# Patient Record
Sex: Female | Born: 1960 | Race: White | Hispanic: No | Marital: Single | Smoking: Never smoker
Health system: Southern US, Community
[De-identification: ages and names within clinical notes are randomized; demographics above are authoritative.]

## PROBLEM LIST (undated history)

## (undated) DIAGNOSIS — M8580 Other specified disorders of bone density and structure, unspecified site: Secondary | ICD-10-CM

## (undated) DIAGNOSIS — J302 Other seasonal allergic rhinitis: Secondary | ICD-10-CM

## (undated) DIAGNOSIS — C801 Malignant (primary) neoplasm, unspecified: Secondary | ICD-10-CM

## (undated) DIAGNOSIS — G4730 Sleep apnea, unspecified: Secondary | ICD-10-CM

## (undated) HISTORY — DX: Malignant (primary) neoplasm, unspecified: C80.1

## (undated) HISTORY — DX: Sleep apnea, unspecified: G47.30

## (undated) HISTORY — DX: Other specified disorders of bone density and structure, unspecified site: M85.80

## (undated) HISTORY — PX: ROTATOR CUFF REPAIR: SHX139

## (undated) HISTORY — PX: LABRAL REPAIR: SHX5172

## (undated) HISTORY — DX: Other seasonal allergic rhinitis: J30.2

## (undated) HISTORY — PX: COLONOSCOPY: SHX174

---
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
## 1989-02-19 HISTORY — PX: KNEE ARTHROSCOPY: SUR90

## 1990-02-19 HISTORY — PX: NASAL SINUS SURGERY: SHX719

## 2000-02-23 ENCOUNTER — Encounter: Payer: Self-pay | Admitting: Emergency Medicine

## 2000-02-23 ENCOUNTER — Emergency Department (HOSPITAL_COMMUNITY): Admission: EM | Admit: 2000-02-23 | Discharge: 2000-02-23 | Payer: Self-pay | Admitting: Emergency Medicine

## 2000-04-09 ENCOUNTER — Ambulatory Visit (HOSPITAL_COMMUNITY): Admission: RE | Admit: 2000-04-09 | Discharge: 2000-04-09 | Payer: Self-pay | Admitting: Orthopedic Surgery

## 2000-04-09 ENCOUNTER — Encounter: Payer: Self-pay | Admitting: Orthopedic Surgery

## 2000-04-18 ENCOUNTER — Encounter: Admission: RE | Admit: 2000-04-18 | Discharge: 2000-06-05 | Payer: Self-pay | Admitting: Orthopedic Surgery

## 2000-08-27 ENCOUNTER — Ambulatory Visit (HOSPITAL_COMMUNITY): Admission: RE | Admit: 2000-08-27 | Discharge: 2000-08-27 | Payer: Self-pay | Admitting: Neurosurgery

## 2000-08-27 ENCOUNTER — Encounter: Payer: Self-pay | Admitting: Neurosurgery

## 2001-11-21 ENCOUNTER — Encounter: Admission: RE | Admit: 2001-11-21 | Discharge: 2001-11-21 | Payer: Self-pay | Admitting: Internal Medicine

## 2001-11-28 ENCOUNTER — Encounter: Admission: RE | Admit: 2001-11-28 | Discharge: 2001-11-28 | Payer: Self-pay | Admitting: Neurosurgery

## 2001-11-28 ENCOUNTER — Encounter: Payer: Self-pay | Admitting: Neurosurgery

## 2001-12-12 ENCOUNTER — Encounter: Admission: RE | Admit: 2001-12-12 | Discharge: 2001-12-12 | Payer: Self-pay | Admitting: Internal Medicine

## 2001-12-15 ENCOUNTER — Encounter: Payer: Self-pay | Admitting: Neurosurgery

## 2001-12-15 ENCOUNTER — Encounter: Admission: RE | Admit: 2001-12-15 | Discharge: 2001-12-15 | Payer: Self-pay | Admitting: Neurosurgery

## 2002-01-05 ENCOUNTER — Encounter: Payer: Self-pay | Admitting: Neurosurgery

## 2002-01-05 ENCOUNTER — Encounter: Admission: RE | Admit: 2002-01-05 | Discharge: 2002-01-05 | Payer: Self-pay | Admitting: Neurosurgery

## 2002-01-14 ENCOUNTER — Encounter: Admission: RE | Admit: 2002-01-14 | Discharge: 2002-01-14 | Payer: Self-pay | Admitting: Internal Medicine

## 2002-03-06 ENCOUNTER — Encounter: Admission: RE | Admit: 2002-03-06 | Discharge: 2002-03-06 | Payer: Self-pay | Admitting: Obstetrics and Gynecology

## 2002-03-06 ENCOUNTER — Encounter: Payer: Self-pay | Admitting: Obstetrics and Gynecology

## 2002-03-09 ENCOUNTER — Encounter: Admission: RE | Admit: 2002-03-09 | Discharge: 2002-03-09 | Payer: Self-pay | Admitting: Internal Medicine

## 2002-11-25 ENCOUNTER — Encounter: Payer: Self-pay | Admitting: Neurosurgery

## 2002-11-25 ENCOUNTER — Encounter: Payer: Self-pay | Admitting: Radiology

## 2002-11-25 ENCOUNTER — Encounter: Admission: RE | Admit: 2002-11-25 | Discharge: 2002-11-25 | Payer: Self-pay | Admitting: Neurosurgery

## 2003-02-25 ENCOUNTER — Encounter: Admission: RE | Admit: 2003-02-25 | Discharge: 2003-02-25 | Payer: Self-pay | Admitting: Neurosurgery

## 2003-07-08 ENCOUNTER — Encounter: Admission: RE | Admit: 2003-07-08 | Discharge: 2003-07-08 | Payer: Self-pay | Admitting: Neurosurgery

## 2004-01-14 ENCOUNTER — Encounter: Admission: RE | Admit: 2004-01-14 | Discharge: 2004-01-14 | Payer: Self-pay | Admitting: Neurosurgery

## 2004-03-15 ENCOUNTER — Encounter: Admission: RE | Admit: 2004-03-15 | Discharge: 2004-03-15 | Payer: Self-pay | Admitting: Neurosurgery

## 2004-08-03 ENCOUNTER — Encounter: Admission: RE | Admit: 2004-08-03 | Discharge: 2004-08-03 | Payer: Self-pay | Admitting: Neurosurgery

## 2004-09-27 ENCOUNTER — Encounter: Admission: RE | Admit: 2004-09-27 | Discharge: 2004-09-27 | Payer: Self-pay | Admitting: Obstetrics and Gynecology

## 2005-01-29 ENCOUNTER — Encounter: Admission: RE | Admit: 2005-01-29 | Discharge: 2005-01-29 | Payer: Self-pay | Admitting: Neurosurgery

## 2005-02-19 DIAGNOSIS — C801 Malignant (primary) neoplasm, unspecified: Secondary | ICD-10-CM

## 2005-02-19 HISTORY — DX: Malignant (primary) neoplasm, unspecified: C80.1

## 2005-02-19 HISTORY — PX: BREAST RECONSTRUCTION: SHX9

## 2005-02-19 HISTORY — PX: OTHER SURGICAL HISTORY: SHX169

## 2005-09-28 ENCOUNTER — Encounter: Admission: RE | Admit: 2005-09-28 | Discharge: 2005-09-28 | Payer: Self-pay | Admitting: Obstetrics and Gynecology

## 2005-10-04 ENCOUNTER — Encounter: Admission: RE | Admit: 2005-10-04 | Discharge: 2005-10-04 | Payer: Self-pay | Admitting: Obstetrics and Gynecology

## 2005-10-04 ENCOUNTER — Encounter (INDEPENDENT_AMBULATORY_CARE_PROVIDER_SITE_OTHER): Payer: Self-pay | Admitting: *Deleted

## 2005-10-04 ENCOUNTER — Encounter (INDEPENDENT_AMBULATORY_CARE_PROVIDER_SITE_OTHER): Payer: Self-pay | Admitting: Radiology

## 2005-10-10 ENCOUNTER — Ambulatory Visit: Payer: Self-pay | Admitting: Oncology

## 2005-10-11 ENCOUNTER — Encounter: Admission: RE | Admit: 2005-10-11 | Discharge: 2005-10-11 | Payer: Self-pay | Admitting: Obstetrics and Gynecology

## 2005-10-11 ENCOUNTER — Encounter (INDEPENDENT_AMBULATORY_CARE_PROVIDER_SITE_OTHER): Payer: Self-pay | Admitting: Specialist

## 2006-03-12 ENCOUNTER — Ambulatory Visit: Admission: RE | Admit: 2006-03-12 | Discharge: 2006-05-23 | Payer: Self-pay | Admitting: Radiation Oncology

## 2006-06-24 ENCOUNTER — Ambulatory Visit: Admission: RE | Admit: 2006-06-24 | Discharge: 2006-08-27 | Payer: Self-pay | Admitting: Radiation Oncology

## 2006-09-12 ENCOUNTER — Ambulatory Visit: Payer: Self-pay | Admitting: Internal Medicine

## 2006-09-18 ENCOUNTER — Ambulatory Visit: Payer: Self-pay | Admitting: Internal Medicine

## 2007-06-03 DIAGNOSIS — Z853 Personal history of malignant neoplasm of breast: Secondary | ICD-10-CM | POA: Insufficient documentation

## 2007-06-03 DIAGNOSIS — Z8719 Personal history of other diseases of the digestive system: Secondary | ICD-10-CM

## 2008-12-07 ENCOUNTER — Ambulatory Visit (HOSPITAL_COMMUNITY): Admission: RE | Admit: 2008-12-07 | Discharge: 2008-12-07 | Payer: Self-pay | Admitting: Neurosurgery

## 2008-12-07 ENCOUNTER — Encounter (INDEPENDENT_AMBULATORY_CARE_PROVIDER_SITE_OTHER): Payer: Self-pay | Admitting: Neurosurgery

## 2009-02-19 HISTORY — PX: ROBOTIC ASSISTED LAP VAGINAL HYSTERECTOMY: SHX2362

## 2010-02-10 ENCOUNTER — Encounter
Admission: RE | Admit: 2010-02-10 | Discharge: 2010-02-10 | Payer: Self-pay | Source: Home / Self Care | Attending: Neurosurgery | Admitting: Neurosurgery

## 2010-05-25 LAB — BASIC METABOLIC PANEL
BUN: 12 mg/dL (ref 6–23)
CO2: 30 mEq/L (ref 19–32)
GFR calc Af Amer: >60 mL/min (ref >60)
GFR calc non Af Amer: >60 mL/min (ref >60)
Potassium: 4 mEq/L (ref 3.5–5.1)
Sodium: 143 mEq/L (ref 135–145)

## 2010-05-25 LAB — CBC
MCV: 86.8 fL (ref 78.0–100.0)
RDW: 14.4 % (ref 11.5–15.5)

## 2010-07-04 NOTE — Assessment & Plan Note (Signed)
[facility]                         GASTROENTEROLOGY OFFICE NOTE   NAME:Szczesniak, DYLANN LAYNE                       MRN:          [id number]  DATE:09/12/2006                            DOB:          02/23/1960    CHIEF COMPLAINT:  Trouble swallowing.   HISTORY:  Ms. Shor is a 50 year old white women that is complaining of  problems with food like corn and rice with a suprasternal sticking  point.  She has had that for some time and even liquids would bother  her.  However, recently she had a couple of episodes, one of which she  required the Heimlich maneuver she says to relieve the food impactions.  She had been seen by Dr. Corinda Gubler in the past for abdominal pain and had  an upper endoscopy with gastritis and was treated for that, she had  Helicobacter pylori which I think she was treated for and that seemed to  resolve.  She does not really have a lot of heartburn problems or  anything like that.   PAST MEDICAL HISTORY:  Diagnosis of breast cancer.  Had bilateral  mastectomy because of premalignant or dysplastic changes in the other  breast with radiation chemo and bilateral breast reconstruction.  Radiation chemotherapy just finished earlier this month.  Followed by  Dr. Abbe Amsterdam in [place].   REVIEW OF SYSTEMS:  She has had some palpitations and sweats.  She has  stiffness in her hip.  She has had a skin rash, rhinorrhea and night  sweats and some hearing difficulty.  All other systems are negative.   MEDICATIONS:  1. Tamoxifen daily.  2. BC powder p.r.n.   DRUG ALLERGIES:  None known.   FAMILY HISTORY:  Negative for colon cancer.  There is breast cancer and  diabetes in the family.   SOCIAL HISTORY:  She is single.  She works in collections.  She is not a  smoker.   PHYSICAL EXAMINATION:  GENERAL:  Reveals a well-developed, well-  nourished middle aged white woman.  VITAL SIGNS:  Height 5 feet 7 inches, weight 153 pounds, pulse 68,  blood  pressure 96/64.  EYES:  Anicteric.  MOUTH:  Posterior pharynx free of wheezes.  NECK:  Supple without thyromegaly or mass.  CHEST:  Clear.  HEART:  S1, S2, no murmurs, rubs or gallops.  ABDOMEN:  Soft, nontender without organomegaly or mass.  LOWER EXTREMITY:  Free of edema.  SKIN:  Warm and dry.  I see no acute rash at this time.  There is some  mild hyperpigmented areas on the back.  LYMPH NODES:  No neck or supraclavicular nodes.   ASSESSMENT:  Dysphagia.  She says this actually started a number of  years ago, even before her radiation chemo.  It sounds more like a  peptic stricture problem.  She does not have heartburn.  Motility  disturbance is possible.  She has choked on some liquids which raises  some question of oropharyngeal problems, but it sounds more like an  esophageal process.   PLAN:  1. Samples of Aciphex 20 mg daily.  2. Modify diet  in the interim to avoid dysphagia as much as possible.  3. Schedule upper GI endoscopy with esophageal dilation.  4. Pending that other studies including barium swallow and manometry      could be indicated.     Iva Boop, MD,FACG  Electronically Signed    CEG/MedQ  DD: 09/12/2006  DT: 09/13/2006  Job #: [id number]   cc:   Bonnye Fava

## 2011-11-16 ENCOUNTER — Telehealth: Payer: Self-pay | Admitting: Internal Medicine

## 2011-11-16 NOTE — Telephone Encounter (Signed)
Dr. Gessner do you approve? 

## 2011-11-16 NOTE — Telephone Encounter (Signed)
Dr. Perry will you accept? 

## 2011-11-16 NOTE — Telephone Encounter (Signed)
sure

## 2011-11-19 ENCOUNTER — Encounter: Payer: Self-pay | Admitting: Internal Medicine

## 2011-11-19 NOTE — Telephone Encounter (Signed)
okay

## 2011-11-19 NOTE — Telephone Encounter (Signed)
Pt scheduled for colon 12/20/11 with Dr. Marina Goodell.

## 2011-12-07 ENCOUNTER — Encounter: Payer: Self-pay | Admitting: Internal Medicine

## 2011-12-07 ENCOUNTER — Ambulatory Visit (AMBULATORY_SURGERY_CENTER): Payer: Managed Care, Other (non HMO) | Admitting: *Deleted

## 2011-12-07 VITALS — Ht 68.0 in | Wt 164.6 lb

## 2011-12-07 DIAGNOSIS — Z1211 Encounter for screening for malignant neoplasm of colon: Secondary | ICD-10-CM

## 2011-12-07 MED ORDER — MOVIPREP 100 G PO SOLR: ORAL | Status: DC

## 2011-12-20 ENCOUNTER — Ambulatory Visit (AMBULATORY_SURGERY_CENTER): Payer: Managed Care, Other (non HMO) | Admitting: Internal Medicine

## 2011-12-20 ENCOUNTER — Encounter: Payer: Self-pay | Admitting: Internal Medicine

## 2011-12-20 VITALS — BP 108/68 | HR 52 | Temp 98.3°F | Resp 14 | Ht 68.0 in | Wt 164.0 lb

## 2011-12-20 DIAGNOSIS — Z1211 Encounter for screening for malignant neoplasm of colon: Secondary | ICD-10-CM

## 2011-12-20 DIAGNOSIS — D126 Benign neoplasm of colon, unspecified: Secondary | ICD-10-CM

## 2011-12-20 MED ORDER — SODIUM CHLORIDE 0.9 % IV SOLN: 500.0000 mL | INTRAVENOUS | Status: DC

## 2011-12-20 NOTE — Progress Notes (Signed)
Patient did not have preoperative order for IV antibiotic SSI prophylaxis. (G8918)  Patient did not experience any of the following events: a burn prior to discharge; a fall within the facility; wrong site/side/patient/procedure/implant event; or a hospital transfer or hospital admission upon discharge from the facility. (G8907)  

## 2011-12-20 NOTE — Op Note (Signed)
[facility] [address] [place], [address]   COLONOSCOPY PROCEDURE REPORT  PATIENT: Kayla Cunningham, Kayla Cunningham  MR#: [id number] BIRTHDATE: 1960/03/06 , 51  yrs. old GENDER: Female ENDOSCOPIST: Roxy Cedar, MD REFERRED BY:.Direct Self PROCEDURE DATE:  12/20/2011 PROCEDURE:   Colonoscopy with snare polypectomy    x 3 ASA CLASS:   Class II INDICATIONS:average risk screening. MEDICATIONS: MAC sedation, administered by CRNA and propofol (Diprivan) 300mg  IV  DESCRIPTION OF PROCEDURE:   After the risks benefits and alternatives of the procedure were thoroughly explained, informed consent was obtained.  A digital rectal exam revealed no abnormalities of the rectum.   The LB [id number]  endoscope was introduced through the anus and advanced to the cecum, which was identified by both the appendix and ileocecal valve. No adverse events experienced.   The quality of the prep was excellent, using MoviPrep  The instrument was then slowly withdrawn as the colon was fully examined.      COLON FINDINGS: Three polyps ranging between 3-20mm in size were found at the cecum, in the ascending colon, and transverse colon. A polypectomy was performed with a cold snare.  The resection was complete and the polyp tissue was completely retrieved.   The colon mucosa was otherwise normal.  Retroflexed views revealed no abnormalities. The time to cecum=8 minutes 04 seconds.  Withdrawal time=11 minutes 18 seconds.  The scope was withdrawn and the procedure completed. COMPLICATIONS: There were no complications.  ENDOSCOPIC IMPRESSION: 1.   Three polyps ranging between 3-64mm in size were found at the cecum, in the ascending colon, and transverse colon; polypectomy was performed with a cold snare 2.   The colon mucosa was otherwise normal  RECOMMENDATIONS: 1. Repeat Colonoscopy in 3 years if all polyps adenomatous; otherwise 5 years.   eSigned:  Roxy Cedar, MD 12/20/2011  11:12 AM   cc: Marinda Elk, MD and The Patient   PATIENT NAME:  Kayla Cunningham, Kayla Cunningham MR#: [id number]

## 2011-12-20 NOTE — Patient Instructions (Signed)
YOU HAD AN ENDOSCOPIC PROCEDURE TODAY AT THE [facility]: Refer to the procedure report that was given to you for any specific questions about what was found during the examination.  If the procedure report does not answer your questions, please call your gastroenterologist to clarify.  If you requested that your care partner not be given the details of your procedure findings, then the procedure report has been included in a sealed envelope for you to review at your convenience later.  YOU SHOULD EXPECT: Some feelings of bloating in the abdomen. Passage of more gas than usual.  Walking can help get rid of the air that was put into your GI tract during the procedure and reduce the bloating. If you had a lower endoscopy (such as a colonoscopy or flexible sigmoidoscopy) you may notice spotting of blood in your stool or on the toilet paper. If you underwent a bowel prep for your procedure, then you may not have a normal bowel movement for a few days.  DIET: Your first meal following the procedure should be a light meal and then it is ok to progress to your normal diet.  A half-sandwich or bowl of soup is an example of a good first meal.  Heavy or fried foods are harder to digest and may make you feel nauseous or bloated.  Likewise meals heavy in dairy and vegetables can cause extra gas to form and this can also increase the bloating.  Drink plenty of fluids but you should avoid alcoholic beverages for 24 hours.  ACTIVITY: Your care partner should take you home directly after the procedure.  You should plan to take it easy, moving slowly for the rest of the day.  You can resume normal activity the day after the procedure however you should NOT DRIVE or use heavy machinery for 24 hours (because of the sedation medicines used during the test).    SYMPTOMS TO REPORT IMMEDIATELY: A gastroenterologist can be reached at any hour.  During normal business hours, 8:30 AM to 5:00 PM Monday through Friday,  call [phone].  After hours and on weekends, please call the GI answering service at [phone] who will take a message and have the physician on call contact you.   Following lower endoscopy (colonoscopy or flexible sigmoidoscopy):  Excessive amounts of blood in the stool  Significant tenderness or worsening of abdominal pains  Swelling of the abdomen that is new, acute  Fever of 100F or higher   FOLLOW UP: If any biopsies were taken you will be contacted by phone or by letter within the next 1-3 weeks.  Call your gastroenterologist if you have not heard about the biopsies in 3 weeks.  Our staff will call the home number listed on your records the next business day following your procedure to check on you and address any questions or concerns that you may have at that time regarding the information given to you following your procedure. This is a courtesy call and so if there is no answer at the home number and we have not heard from you through the emergency physician on call, we will assume that you have returned to your regular daily activities without incident.  SIGNATURES/CONFIDENTIALITY: You and/or your care partner have signed paperwork which will be entered into your electronic medical record.  These signatures attest to the fact that that the information above on your After Visit Summary has been reviewed and is understood.  Full responsibility of the confidentiality of   this discharge information lies with you and/or your care-partner.   INFORMATION ON POLYPS GIVEN TO YOU TODAY 

## 2011-12-21 ENCOUNTER — Telehealth: Payer: Self-pay | Admitting: *Deleted

## 2011-12-21 NOTE — Telephone Encounter (Signed)
  Follow up Call-  Call back number 12/20/2011  Post procedure Call Back phone  # [phone]  Permission to leave phone message Yes     Patient questions:  Do you have a fever, pain , or abdominal swelling? no Pain Score  0 *  Have you tolerated food without any problems? yes  Have you been able to return to your normal activities? yes  Do you have any questions about your discharge instructions: Diet   no Medications  no Follow up visit  no  Do you have questions or concerns about your Care? no  Actions: * If pain score is 4 or above: No action needed, pain <4.

## 2011-12-26 ENCOUNTER — Encounter: Payer: Self-pay | Admitting: Internal Medicine

## 2012-10-02 DIAGNOSIS — M8580 Other specified disorders of bone density and structure, unspecified site: Secondary | ICD-10-CM | POA: Insufficient documentation

## 2013-03-31 DIAGNOSIS — Z5181 Encounter for therapeutic drug level monitoring: Secondary | ICD-10-CM | POA: Insufficient documentation

## 2014-02-19 HISTORY — PX: LIPOMA EXCISION: SHX5283

## 2014-07-29 DIAGNOSIS — Z9013 Acquired absence of bilateral breasts and nipples: Secondary | ICD-10-CM | POA: Insufficient documentation

## 2014-10-28 ENCOUNTER — Encounter: Payer: Self-pay | Admitting: Internal Medicine

## 2014-12-30 ENCOUNTER — Ambulatory Visit (AMBULATORY_SURGERY_CENTER): Payer: Self-pay

## 2014-12-30 VITALS — Ht 67.0 in | Wt 174.4 lb

## 2014-12-30 DIAGNOSIS — Z860100 Personal history of colon polyps, unspecified: Secondary | ICD-10-CM

## 2014-12-30 MED ORDER — SUPREP BOWEL PREP KIT 17.5-3.13-1.6 GM/177ML PO SOLN: 1.0000 | Freq: Once | ORAL | Status: DC

## 2014-12-30 NOTE — Progress Notes (Signed)
No allergies to eggs or soy No home oxygen (CPAP only) No diet/weight loss meds No past problems with anesthesia  Refused emmi; has email has internet

## 2015-01-06 ENCOUNTER — Ambulatory Visit (AMBULATORY_SURGERY_CENTER): Payer: Managed Care, Other (non HMO) | Admitting: Internal Medicine

## 2015-01-06 ENCOUNTER — Encounter: Payer: Self-pay | Admitting: Internal Medicine

## 2015-01-06 VITALS — BP 137/63 | HR 62 | Temp 97.6°F | Resp 18 | Ht 67.0 in | Wt 174.0 lb

## 2015-01-06 DIAGNOSIS — Z86010 Personal history of colonic polyps: Secondary | ICD-10-CM | POA: Diagnosis present

## 2015-01-06 DIAGNOSIS — D123 Benign neoplasm of transverse colon: Secondary | ICD-10-CM | POA: Diagnosis not present

## 2015-01-06 MED ORDER — SODIUM CHLORIDE 0.9 % IV SOLN: 500.0000 mL | INTRAVENOUS | Status: DC

## 2015-01-06 NOTE — Op Note (Signed)
[facility]  [address] [place], [id number]   COLONOSCOPY PROCEDURE REPORT  PATIENT: Kayla Cunningham, Kayla Cunningham  MR#: [id number] BIRTHDATE: 11/11/1960 , 35  yrs. old GENDER: female ENDOSCOPIST: Eustace Quail, MD REFERRED [id number] Program Recall PROCEDURE DATE:  01/06/2015 PROCEDURE:   Colonoscopy, surveillance and Colonoscopy with snare polypectomy x 1 First Screening Colonoscopy - Avg.  risk and is 50 yrs.  old or older - No.  Prior Negative Screening - Now for repeat screening. N/A  History of Adenoma - Now for follow-up colonoscopy & has been > or = to 3 yrs.  Yes hx of adenoma.  Has been 3 or more years since last colonoscopy.  Polyps removed today? Yes ASA CLASS:   Class II INDICATIONS:Surveillance due to prior colonic neoplasia and PH Colon Adenoma.   . Index examination October 2013 w/ 3 tubular adenomas MEDICATIONS: Monitored anesthesia care and Propofol 300 mg IV  DESCRIPTION OF PROCEDURE:   After the risks benefits and alternatives of the procedure were thoroughly explained, informed consent was obtained.  The digital rectal exam revealed no abnormalities of the rectum.   The LB [id number]  endoscope was introduced through the anus and advanced to the cecum, which was identified by both the appendix and ileocecal valve. No adverse events experienced.   The quality of the prep was excellent. (Suprep was used)  The instrument was then slowly withdrawn as the colon was fully examined. Estimated blood loss is zero unless otherwise noted in this procedure report.      COLON FINDINGS: A single polyp measuring 5 mm in size was found in the transverse colon.  A polypectomy was performed with a cold snare.  The resection was complete, the polyp tissue was completely retrieved and sent to histology.   The examination was otherwise normal.  Retroflexed views revealed no abnormalities. The time to cecum = 5.8 Withdrawal time = 12.2   The scope was  withdrawn and the procedure completed. COMPLICATIONS: There were no immediate complications.  ENDOSCOPIC IMPRESSION: 1.   Single polyp was found in the transverse colon; polypectomy was performed with a cold snare 2.   The examination was otherwise normal  RECOMMENDATIONS: 1. Follow up colonoscopy in 5 years  eSigned:  Eustace Quail, MD 01/06/2015 11:27 AM   cc: The Patient and Briscoe Deutscher, MD

## 2015-01-06 NOTE — Patient Instructions (Signed)
YOU HAD AN ENDOSCOPIC PROCEDURE TODAY AT THE [facility]:   Refer to the procedure report that was given to you for any specific questions about what was found during the examination.  If the procedure report does not answer your questions, please call your gastroenterologist to clarify.  If you requested that your care partner not be given the details of your procedure findings, then the procedure report has been included in a sealed envelope for you to review at your convenience later.  YOU SHOULD EXPECT: Some feelings of bloating in the abdomen. Passage of more gas than usual.  Walking can help get rid of the air that was put into your GI tract during the procedure and reduce the bloating. If you had a lower endoscopy (such as a colonoscopy or flexible sigmoidoscopy) you may notice spotting of blood in your stool or on the toilet paper. If you underwent a bowel prep for your procedure, you may not have a normal bowel movement for a few days.  Please Note:  You might notice some irritation and congestion in your nose or some drainage.  This is from the oxygen used during your procedure.  There is no need for concern and it should clear up in a day or so.  SYMPTOMS TO REPORT IMMEDIATELY:   Following lower endoscopy (colonoscopy or flexible sigmoidoscopy):  Excessive amounts of blood in the stool  Significant tenderness or worsening of abdominal pains  Swelling of the abdomen that is new, acute  Fever of 100F or higher   For urgent or emergent issues, a gastroenterologist can be reached at any hour by calling [phone].   DIET: Your first meal following the procedure should be a small meal and then it is ok to progress to your normal diet. Heavy or fried foods are harder to digest and may make you feel nauseous or bloated.  Likewise, meals heavy in dairy and vegetables can increase bloating.  Drink plenty of fluids but you should avoid alcoholic beverages for 24  hours.  ACTIVITY:  You should plan to take it easy for the rest of today and you should NOT DRIVE or use heavy machinery until tomorrow (because of the sedation medicines used during the test).    FOLLOW UP: Our staff will call the number listed on your records the next business day following your procedure to check on you and address any questions or concerns that you may have regarding the information given to you following your procedure. If we do not reach you, we will leave a message.  However, if you are feeling well and you are not experiencing any problems, there is no need to return our call.  We will assume that you have returned to your regular daily activities without incident.  If any biopsies were taken you will be contacted by phone or by letter within the next 1-3 weeks.  Please call us at [phone] if you have not heard about the biopsies in 3 weeks.    SIGNATURES/CONFIDENTIALITY: You and/or your care partner have signed paperwork which will be entered into your electronic medical record.  These signatures attest to the fact that that the information above on your After Visit Summary has been reviewed and is understood.  Full responsibility of the confidentiality of this discharge information lies with you and/or your care-partner.  Polyp information given.  

## 2015-01-06 NOTE — Progress Notes (Signed)
A/ox3 pleased with MAC, report to Jane RN 

## 2015-01-06 NOTE — Progress Notes (Signed)
Called to room to assist during endoscopic procedure.  Patient ID and intended procedure confirmed with present staff. Received instructions for my participation in the procedure from the performing physician.  

## 2015-01-07 ENCOUNTER — Telehealth: Payer: Self-pay | Admitting: Internal Medicine

## 2015-01-07 NOTE — Telephone Encounter (Signed)
°  Follow up Call-  Call back number 01/06/2015  Post procedure Call Back phone  # [phone]  Permission to leave phone message Yes     Patient questions:  Do you have a fever, pain , or abdominal swelling? No. Pain Score  0 *  Have you tolerated food without any problems? Yes.    Have you been able to return to your normal activities? Yes.    Do you have any questions about your discharge instructions: Diet   No. Medications  No. Follow up visit  No.  Do you have questions or concerns about your Care? No.  Actions: * If pain score is 4 or above: No action needed, pain <4.

## 2015-01-11 ENCOUNTER — Encounter: Payer: Self-pay | Admitting: Internal Medicine

## 2016-05-25 DIAGNOSIS — Z9189 Other specified personal risk factors, not elsewhere classified: Secondary | ICD-10-CM | POA: Insufficient documentation

## 2017-12-08 DIAGNOSIS — H029 Unspecified disorder of eyelid: Secondary | ICD-10-CM | POA: Insufficient documentation

## 2018-02-04 ENCOUNTER — Other Ambulatory Visit: Payer: Self-pay | Admitting: Neurosurgery

## 2018-02-04 DIAGNOSIS — M4712 Other spondylosis with myelopathy, cervical region: Secondary | ICD-10-CM

## 2018-02-10 ENCOUNTER — Ambulatory Visit
Admission: RE | Admit: 2018-02-10 | Discharge: 2018-02-10 | Disposition: A | Discharge status: Home / Self Care | Payer: Commercial Managed Care - PPO | Source: Ambulatory Visit | Attending: Neurosurgery | Admitting: Neurosurgery

## 2018-02-10 ENCOUNTER — Other Ambulatory Visit: Payer: Self-pay | Admitting: Neurosurgery

## 2018-02-10 DIAGNOSIS — M4712 Other spondylosis with myelopathy, cervical region: Secondary | ICD-10-CM

## 2018-02-10 DIAGNOSIS — M545 Low back pain, unspecified: Secondary | ICD-10-CM

## 2018-02-20 DIAGNOSIS — S39012A Strain of muscle, fascia and tendon of lower back, initial encounter: Secondary | ICD-10-CM | POA: Diagnosis not present

## 2018-02-20 DIAGNOSIS — M4712 Other spondylosis with myelopathy, cervical region: Secondary | ICD-10-CM | POA: Diagnosis not present

## 2018-02-20 DIAGNOSIS — S161XXA Strain of muscle, fascia and tendon at neck level, initial encounter: Secondary | ICD-10-CM | POA: Diagnosis not present

## 2018-03-25 DIAGNOSIS — S39012A Strain of muscle, fascia and tendon of lower back, initial encounter: Secondary | ICD-10-CM | POA: Diagnosis not present

## 2018-03-25 DIAGNOSIS — M47816 Spondylosis without myelopathy or radiculopathy, lumbar region: Secondary | ICD-10-CM | POA: Diagnosis not present

## 2018-03-27 ENCOUNTER — Other Ambulatory Visit: Payer: Self-pay | Admitting: Neurosurgery

## 2018-03-27 DIAGNOSIS — M47816 Spondylosis without myelopathy or radiculopathy, lumbar region: Secondary | ICD-10-CM

## 2018-04-17 ENCOUNTER — Other Ambulatory Visit: Payer: Self-pay | Admitting: Neurosurgery

## 2018-04-17 DIAGNOSIS — M47816 Spondylosis without myelopathy or radiculopathy, lumbar region: Secondary | ICD-10-CM

## 2018-04-23 ENCOUNTER — Ambulatory Visit
Admission: RE | Admit: 2018-04-23 | Discharge: 2018-04-23 | Disposition: A | Discharge status: Home / Self Care | Payer: Commercial Managed Care - PPO | Source: Ambulatory Visit | Attending: Neurosurgery | Admitting: Neurosurgery

## 2018-04-23 DIAGNOSIS — M5136 Other intervertebral disc degeneration, lumbar region: Secondary | ICD-10-CM | POA: Diagnosis not present

## 2018-04-23 DIAGNOSIS — M47816 Spondylosis without myelopathy or radiculopathy, lumbar region: Secondary | ICD-10-CM

## 2018-04-29 ENCOUNTER — Ambulatory Visit
Admission: RE | Admit: 2018-04-29 | Discharge: 2018-04-29 | Disposition: A | Discharge status: Home / Self Care | Payer: Commercial Managed Care - PPO | Source: Ambulatory Visit | Attending: Neurosurgery | Admitting: Neurosurgery

## 2018-04-29 DIAGNOSIS — M47816 Spondylosis without myelopathy or radiculopathy, lumbar region: Secondary | ICD-10-CM | POA: Diagnosis not present

## 2018-04-29 MED ORDER — IOPAMIDOL (ISOVUE-M 200) INJECTION 41%
1.0000 mL | Freq: Once | INTRAMUSCULAR | Status: AC
  Administered 2018-04-29: 1 mL via EPIDURAL

## 2018-04-29 MED ORDER — METHYLPREDNISOLONE ACETATE 40 MG/ML INJ SUSP (RADIOLOG
120.0000 mg | Freq: Once | INTRAMUSCULAR | Status: AC
  Administered 2018-04-29: 120 mg via EPIDURAL

## 2018-04-29 NOTE — Discharge Instructions (Signed)

## 2018-05-20 DIAGNOSIS — S39012A Strain of muscle, fascia and tendon of lower back, initial encounter: Secondary | ICD-10-CM | POA: Diagnosis not present

## 2018-05-20 DIAGNOSIS — M47816 Spondylosis without myelopathy or radiculopathy, lumbar region: Secondary | ICD-10-CM | POA: Diagnosis not present

## 2018-06-11 DIAGNOSIS — M8589 Other specified disorders of bone density and structure, multiple sites: Secondary | ICD-10-CM | POA: Diagnosis not present

## 2018-06-11 DIAGNOSIS — Z9013 Acquired absence of bilateral breasts and nipples: Secondary | ICD-10-CM | POA: Diagnosis not present

## 2018-06-11 DIAGNOSIS — Z170 Estrogen receptor positive status [ER+]: Secondary | ICD-10-CM | POA: Diagnosis not present

## 2018-06-11 DIAGNOSIS — C50811 Malignant neoplasm of overlapping sites of right female breast: Secondary | ICD-10-CM | POA: Diagnosis not present

## 2018-06-11 DIAGNOSIS — Z9189 Other specified personal risk factors, not elsewhere classified: Secondary | ICD-10-CM | POA: Diagnosis not present

## 2018-06-11 DIAGNOSIS — E559 Vitamin D deficiency, unspecified: Secondary | ICD-10-CM | POA: Diagnosis not present

## 2019-02-02 ENCOUNTER — Telehealth: Payer: Self-pay | Admitting: Internal Medicine

## 2019-02-02 NOTE — Telephone Encounter (Signed)
Patient would like to have her colonoscopy a year early for insurance purposes.  Okay to schedule direct?

## 2019-02-03 ENCOUNTER — Encounter: Payer: Self-pay | Admitting: Internal Medicine

## 2019-02-03 NOTE — Telephone Encounter (Signed)
I have reviewed her chart.  Sure, okay to proceed for direct colonoscopy in [place].

## 2019-02-04 DIAGNOSIS — L905 Scar conditions and fibrosis of skin: Secondary | ICD-10-CM | POA: Insufficient documentation

## 2019-02-04 DIAGNOSIS — D2330 Other benign neoplasm of skin of unspecified part of face: Secondary | ICD-10-CM | POA: Insufficient documentation

## 2019-02-04 DIAGNOSIS — D179 Benign lipomatous neoplasm, unspecified: Secondary | ICD-10-CM | POA: Insufficient documentation

## 2019-02-06 ENCOUNTER — Ambulatory Visit: Payer: Commercial Managed Care - PPO | Admitting: Nurse Practitioner

## 2019-02-26 ENCOUNTER — Other Ambulatory Visit: Payer: Self-pay

## 2019-02-26 ENCOUNTER — Ambulatory Visit (AMBULATORY_SURGERY_CENTER): Payer: Commercial Managed Care - PPO | Admitting: *Deleted

## 2019-02-26 VITALS — Temp 98.0°F | Ht 67.0 in | Wt 175.4 lb

## 2019-02-26 DIAGNOSIS — Z1159 Encounter for screening for other viral diseases: Secondary | ICD-10-CM

## 2019-02-26 DIAGNOSIS — Z86010 Personal history of colonic polyps: Secondary | ICD-10-CM

## 2019-02-26 MED ORDER — SUPREP BOWEL PREP KIT 17.5-3.13-1.6 GM/177ML PO SOLN: ORAL | 0 refills | Status: DC

## 2019-02-26 NOTE — Progress Notes (Signed)
BP dropped during hysterectomy, but no other issues  Pt is aware that care partner will wait in the car during procedure; if they feel like they will be too hot or cold to wait in the car; they may wait in the 4 th floor lobby. Patient is aware to bring only one care partner. We want them to wear a mask (we do not have any that we can provide them), practice social distancing, and we will check their temperatures when they get here.  I did remind the patient that their care partner needs to stay in the parking lot the entire time and have a cell phone available, we will call them when the pt is ready for discharge. Patient will wear mask into building.  No egg or soy allergy  No home oxygen use   No medications for weight loss taken  emmi information given  covid test 03-02-19 at 1205

## 2019-02-27 ENCOUNTER — Encounter: Payer: Self-pay | Admitting: Internal Medicine

## 2019-03-02 ENCOUNTER — Ambulatory Visit (INDEPENDENT_AMBULATORY_CARE_PROVIDER_SITE_OTHER): Payer: Commercial Managed Care - PPO

## 2019-03-02 DIAGNOSIS — Z1159 Encounter for screening for other viral diseases: Secondary | ICD-10-CM

## 2019-03-03 LAB — SARS CORONAVIRUS 2 (TAT 6-24 HRS): SARS Coronavirus 2: NEGATIVE

## 2019-03-05 ENCOUNTER — Other Ambulatory Visit: Payer: Self-pay

## 2019-03-05 ENCOUNTER — Ambulatory Visit (AMBULATORY_SURGERY_CENTER): Payer: Commercial Managed Care - PPO | Admitting: Internal Medicine

## 2019-03-05 ENCOUNTER — Encounter: Payer: Self-pay | Admitting: Internal Medicine

## 2019-03-05 VITALS — BP 139/78 | HR 59 | Temp 98.5°F | Resp 10 | Ht 67.0 in | Wt 175.4 lb

## 2019-03-05 DIAGNOSIS — D122 Benign neoplasm of ascending colon: Secondary | ICD-10-CM

## 2019-03-05 DIAGNOSIS — Z86010 Personal history of colonic polyps: Secondary | ICD-10-CM

## 2019-03-05 MED ORDER — SODIUM CHLORIDE 0.9 % IV SOLN: 500.0000 mL | Freq: Once | INTRAVENOUS | Status: DC

## 2019-03-05 NOTE — Op Note (Signed)
[facility] Patient Name: Kayla Cunningham Procedure Date: 03/05/2019 11:27 AM MRN: [id number] Endoscopist: Docia Chuck. Henrene Pastor , MD Age: 59 Referring MD:  Date of Birth: 04-Mar-1960 Gender: Female Account #: [id number] Procedure:                Colonoscopy with cold snare polypectomy x 1 Indications:              High risk colon cancer surveillance: Personal                            history of multiple (3 or more) adenomas. Previous                            examinations 2013, 2016 Medicines:                Monitored Anesthesia Care Procedure:                Pre-Anesthesia Assessment:                           - Prior to the procedure, a History and Physical                            was performed, and patient medications and                            allergies were reviewed. The patient's tolerance of                            previous anesthesia was also reviewed. The risks                            and benefits of the procedure and the sedation                            options and risks were discussed with the patient.                            All questions were answered, and informed consent                            was obtained. Prior Anticoagulants: The patient has                            taken no previous anticoagulant or antiplatelet                            agents. ASA Grade Assessment: II - A patient with                            mild systemic disease. After reviewing the risks                            and benefits, the patient was deemed in  satisfactory condition to undergo the procedure.                           After obtaining informed consent, the colonoscope                            was passed under direct vision. Throughout the                            procedure, the patient's blood pressure, pulse, and                            oxygen saturations were monitored continuously. The   Colonoscope was introduced through the anus and                            advanced to the the cecum, identified by                            appendiceal orifice and ileocecal valve. The                            ileocecal valve, appendiceal orifice, and rectum                            were photographed. The quality of the bowel                            preparation was excellent. The colonoscopy was                            performed without difficulty. The patient tolerated                            the procedure well. The bowel preparation used was                            SUPREP via split dose instruction. Scope In: 11:39:59 AM Scope Out: 11:55:45 AM Scope Withdrawal Time: 0 hours 10 minutes 35 seconds  Total Procedure Duration: 0 hours 15 minutes 46 seconds  Findings:                 A 5 mm polyp was found in the ascending colon. The                            polyp was sessile. The polyp was removed with a                            cold snare. Resection and retrieval were complete.                           The exam was otherwise without abnormality on  direct and retroflexion views. Complications:            No immediate complications. Estimated blood loss:                            None. Estimated Blood Loss:     Estimated blood loss: none. Impression:               - One 5 mm polyp in the ascending colon, removed                            with a cold snare. Resected and retrieved.                           - The examination was otherwise normal on direct                            and retroflexion views. Recommendation:           - Repeat colonoscopy in 5 years for surveillance.                           - Patient has a contact number available for                            emergencies. The signs and symptoms of potential                            delayed complications were discussed with the                            patient. Return to  normal activities tomorrow.                            Written discharge instructions were provided to the                            patient.                           - Resume previous diet.                           - Continue present medications.                           - Await pathology results. Docia Chuck. Henrene Pastor, MD 03/05/2019 12:00:28 PM This report has been signed electronically.

## 2019-03-05 NOTE — Progress Notes (Signed)
Report to PACU, RN, vss, BBS= Clear.  

## 2019-03-05 NOTE — Progress Notes (Signed)
Temp-CSZ VS-CW  Pt's states no medical or surgical changes since previsit or office visit.

## 2019-03-05 NOTE — Patient Instructions (Signed)
Handouts given:  Polyps  Await pathology results Resume previous diet Continue present medications    YOU HAD AN ENDOSCOPIC PROCEDURE TODAY AT [place]:   Refer to the procedure report that was given to you for any specific questions about what was found during the examination.  If the procedure report does not answer your questions, please call your gastroenterologist to clarify.  If you requested that your care partner not be given the details of your procedure findings, then the procedure report has been included in a sealed envelope for you to review at your convenience later.  YOU SHOULD EXPECT: Some feelings of bloating in the abdomen. Passage of more gas than usual.  Walking can help get rid of the air that was put into your GI tract during the procedure and reduce the bloating. If you had a lower endoscopy (such as a colonoscopy or flexible sigmoidoscopy) you may notice spotting of blood in your stool or on the toilet paper. If you underwent a bowel prep for your procedure, you may not have a normal bowel movement for a few days.  Please Note:  You might notice some irritation and congestion in your nose or some drainage.  This is from the oxygen used during your procedure.  There is no need for concern and it should clear up in a day or so.  SYMPTOMS TO REPORT IMMEDIATELY:   Following lower endoscopy (colonoscopy or flexible sigmoidoscopy):  Excessive amounts of blood in the stool  Significant tenderness or worsening of abdominal pains  Swelling of the abdomen that is new, acute  Fever of 100F or higher   For urgent or emergent issues, a gastroenterologist can be reached at any hour by calling [phone].   DIET:  We do recommend a small meal at first, but then you may proceed to your regular diet.  Drink plenty of fluids but you should avoid alcoholic beverages for 24 hours.  ACTIVITY:  You should plan to take it easy for the rest of today and you should  NOT DRIVE or use heavy machinery until tomorrow (because of the sedation medicines used during the test).    FOLLOW UP: Our staff will call the number listed on your records 48-72 hours following your procedure to check on you and address any questions or concerns that you may have regarding the information given to you following your procedure. If we do not reach you, we will leave a message.  We will attempt to reach you two times.  During this call, we will ask if you have developed any symptoms of COVID 19. If you develop any symptoms (ie: fever, flu-like symptoms, shortness of breath, cough etc.) before then, please call [phone].  If you test positive for Covid 19 in the 2 weeks post procedure, please call and report this information to [place].    If any biopsies were taken you will be contacted by phone or by letter within the next 1-3 weeks.  Please call us at [phone] if you have not heard about the biopsies in 3 weeks.    SIGNATURES/CONFIDENTIALITY: You and/or your care partner have signed paperwork which will be entered into your electronic medical record.  These signatures attest to the fact that that the information above on your After Visit Summary has been reviewed and is understood.  Full responsibility of the confidentiality of this discharge information lies with you and/or your care-partner.

## 2019-03-05 NOTE — Progress Notes (Signed)
Called to room to assist during endoscopic procedure.  Patient ID and intended procedure confirmed with present staff. Received instructions for my participation in the procedure from the performing physician.  

## 2019-03-09 ENCOUNTER — Telehealth: Payer: Self-pay

## 2019-03-09 NOTE — Telephone Encounter (Signed)
  Follow up Call-  Call back number 03/05/2019  Post procedure Call Back phone  # [phone]  Permission to leave phone message Yes  Some recent data might be hidden     Patient questions:  Do you have a fever, pain , or abdominal swelling? No. Pain Score  0 *  Have you tolerated food without any problems? Yes.    Have you been able to return to your normal activities? Yes.    Do you have any questions about your discharge instructions: Diet   No. Medications  No. Follow up visit  No.  Do you have questions or concerns about your Care? No.  Actions: * If pain score is 4 or above: No action needed, pain <4.  Pt said she has developed a small hemorrhoid and asked what to do.  I said to try OTC preparation H.  If sx do not improve to call us back. Maw  1. Have you developed a fever since your procedure? no  2.   Have you had an respiratory symptoms (SOB or cough) since your procedure? no  3.   Have you tested positive for COVID 19 since your procedure no  4.   Have you had any family members/close contacts diagnosed with the COVID 19 since your procedure?  no   If yes to any of these questions please route to Joylene John, RN and Alphonsa Gin, Therapist, sports.

## 2019-03-09 NOTE — Telephone Encounter (Signed)
1st follow up call made.  NALM 

## 2019-03-10 ENCOUNTER — Encounter: Payer: Self-pay | Admitting: Internal Medicine

## 2019-03-11 ENCOUNTER — Telehealth: Payer: Self-pay | Admitting: Internal Medicine

## 2019-03-11 NOTE — Telephone Encounter (Signed)
Pt states she has some tissue at her rectum that is inflamed and sore. Pt wants to know what she can try for the discomfort. Discussed with her that she can try Recticare. Pt instructed to call back if this does not help. Pt verbalized understanding.

## 2019-10-08 ENCOUNTER — Encounter: Payer: Self-pay | Admitting: Genetic Counselor
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
## 2021-01-11 IMAGING — XA Imaging study
2 series · 2 of 2 positions shown · non-contrast
Comparison: none

CLINICAL DATA: Spondylosis without myelopathy. Low back pain,
slightly worse on the right than the left.

[Series 1: ortho adipose · 1 of 1 slices shown (1 of 2)]
[im 1/1]
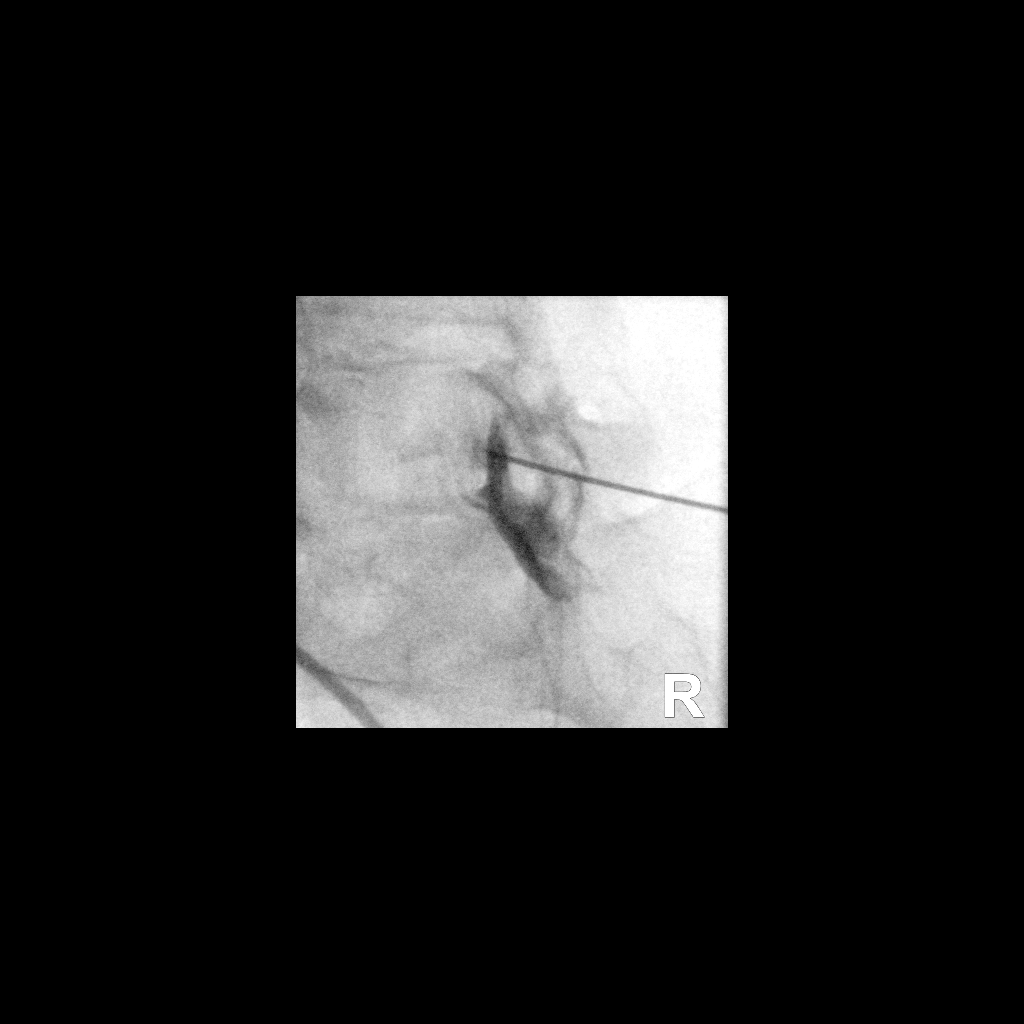

[Series 2: ortho adipose · 1 of 1 slices shown (2 of 2)]
[im 1/1]
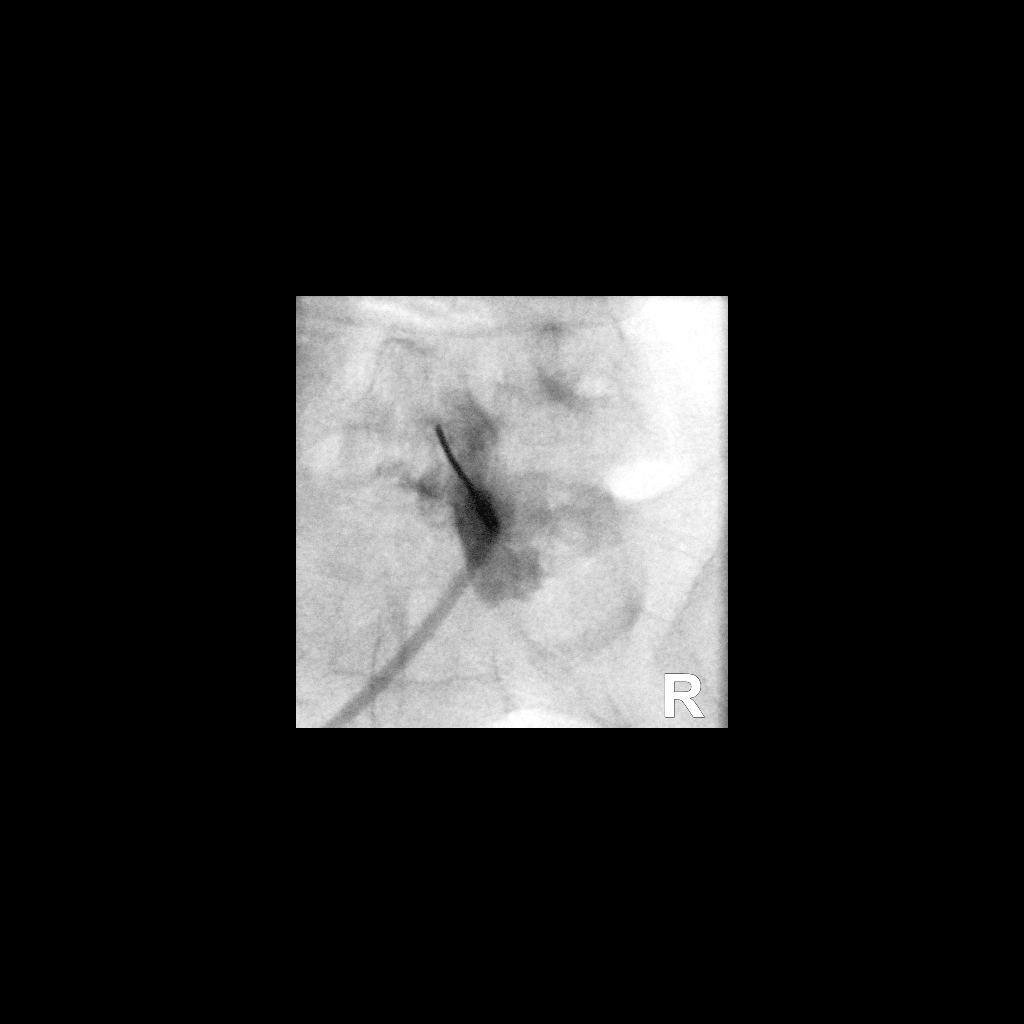

[2 of 2 positions shown; findings below may reference images not displayed]

FLUOROSCOPY TIME:  0 minutes 22 seconds. 18.65 micro gray meter
squared

PROCEDURE:
The procedure, risks, benefits, and alternatives were explained to
the patient. Questions regarding the procedure were encouraged and
answered. The patient understands and consents to the procedure.

LUMBAR EPIDURAL INJECTION:

An interlaminar approach was performed on the right at L5-S1. The
overlying skin was cleansed and anesthetized. A 20 gauge epidural
needle was advanced using loss-of-resistance technique.

DIAGNOSTIC EPIDURAL INJECTION:

Injection of Isovue-M 200 shows a good epidural pattern with spread
above and below the level of needle placement, primarily on the
right, but to both sides. No vascular opacification is seen.

THERAPEUTIC EPIDURAL INJECTION:

One hundred twenty mg of Depo-Medrol mixed with 3 cc 1% lidocaine
were instilled. The procedure was well-tolerated, and the patient
was discharged thirty minutes following the injection in good
condition.

COMPLICATIONS:
None
IMPRESSION: Technically successful epidural injection on the right at L5-S1 # 1.

## 2021-02-27 DIAGNOSIS — Z13220 Encounter for screening for lipoid disorders: Secondary | ICD-10-CM | POA: Diagnosis not present

## 2021-02-27 DIAGNOSIS — E559 Vitamin D deficiency, unspecified: Secondary | ICD-10-CM | POA: Diagnosis not present

## 2021-02-27 DIAGNOSIS — Z0000 Encounter for general adult medical examination without abnormal findings: Secondary | ICD-10-CM | POA: Diagnosis not present

## 2021-02-27 DIAGNOSIS — Z131 Encounter for screening for diabetes mellitus: Secondary | ICD-10-CM | POA: Diagnosis not present

## 2021-03-20 DIAGNOSIS — R899 Unspecified abnormal finding in specimens from other organs, systems and tissues: Secondary | ICD-10-CM | POA: Diagnosis not present

## 2021-03-28 DIAGNOSIS — Z01419 Encounter for gynecological examination (general) (routine) without abnormal findings: Secondary | ICD-10-CM | POA: Diagnosis not present

## 2021-09-18 DIAGNOSIS — F909 Attention-deficit hyperactivity disorder, unspecified type: Secondary | ICD-10-CM | POA: Diagnosis not present

## 2021-10-06 DIAGNOSIS — N951 Menopausal and female climacteric states: Secondary | ICD-10-CM | POA: Diagnosis not present

## 2021-10-06 DIAGNOSIS — C50811 Malignant neoplasm of overlapping sites of right female breast: Secondary | ICD-10-CM | POA: Diagnosis not present

## 2021-10-06 DIAGNOSIS — Z9189 Other specified personal risk factors, not elsewhere classified: Secondary | ICD-10-CM | POA: Diagnosis not present

## 2021-10-06 DIAGNOSIS — C50919 Malignant neoplasm of unspecified site of unspecified female breast: Secondary | ICD-10-CM | POA: Diagnosis not present

## 2021-10-06 DIAGNOSIS — Z9013 Acquired absence of bilateral breasts and nipples: Secondary | ICD-10-CM | POA: Diagnosis not present

## 2021-10-06 DIAGNOSIS — R079 Chest pain, unspecified: Secondary | ICD-10-CM | POA: Diagnosis not present

## 2021-10-06 DIAGNOSIS — E559 Vitamin D deficiency, unspecified: Secondary | ICD-10-CM | POA: Diagnosis not present

## 2021-10-06 DIAGNOSIS — Z170 Estrogen receptor positive status [ER+]: Secondary | ICD-10-CM | POA: Diagnosis not present

## 2022-02-08 DIAGNOSIS — J069 Acute upper respiratory infection, unspecified: Secondary | ICD-10-CM | POA: Diagnosis not present

## 2022-03-07 DIAGNOSIS — Z4789 Encounter for other orthopedic aftercare: Secondary | ICD-10-CM | POA: Diagnosis not present

## 2022-03-12 DIAGNOSIS — Z131 Encounter for screening for diabetes mellitus: Secondary | ICD-10-CM | POA: Diagnosis not present

## 2022-03-12 DIAGNOSIS — Z0000 Encounter for general adult medical examination without abnormal findings: Secondary | ICD-10-CM | POA: Diagnosis not present

## 2022-03-12 DIAGNOSIS — Z853 Personal history of malignant neoplasm of breast: Secondary | ICD-10-CM | POA: Diagnosis not present

## 2022-03-12 DIAGNOSIS — E785 Hyperlipidemia, unspecified: Secondary | ICD-10-CM | POA: Diagnosis not present

## 2022-03-12 DIAGNOSIS — E559 Vitamin D deficiency, unspecified: Secondary | ICD-10-CM | POA: Diagnosis not present

## 2022-06-15 DIAGNOSIS — S92511A Displaced fracture of proximal phalanx of right lesser toe(s), initial encounter for closed fracture: Secondary | ICD-10-CM | POA: Diagnosis not present

## 2022-06-15 DIAGNOSIS — T1490XA Injury, unspecified, initial encounter: Secondary | ICD-10-CM | POA: Diagnosis not present

## 2022-09-17 DIAGNOSIS — F901 Attention-deficit hyperactivity disorder, predominantly hyperactive type: Secondary | ICD-10-CM | POA: Diagnosis not present

## 2022-09-17 DIAGNOSIS — B001 Herpesviral vesicular dermatitis: Secondary | ICD-10-CM | POA: Diagnosis not present

## 2022-09-17 DIAGNOSIS — J309 Allergic rhinitis, unspecified: Secondary | ICD-10-CM | POA: Diagnosis not present

## 2022-09-24 DIAGNOSIS — R072 Precordial pain: Secondary | ICD-10-CM | POA: Diagnosis not present

## 2022-09-24 DIAGNOSIS — R0789 Other chest pain: Secondary | ICD-10-CM | POA: Diagnosis not present

## 2022-11-02 DIAGNOSIS — Z170 Estrogen receptor positive status [ER+]: Secondary | ICD-10-CM | POA: Diagnosis not present

## 2022-11-02 DIAGNOSIS — M8589 Other specified disorders of bone density and structure, multiple sites: Secondary | ICD-10-CM | POA: Diagnosis not present

## 2022-11-02 DIAGNOSIS — C50811 Malignant neoplasm of overlapping sites of right female breast: Secondary | ICD-10-CM | POA: Diagnosis not present

## 2022-11-02 DIAGNOSIS — Z9013 Acquired absence of bilateral breasts and nipples: Secondary | ICD-10-CM | POA: Diagnosis not present

## 2022-11-02 DIAGNOSIS — E559 Vitamin D deficiency, unspecified: Secondary | ICD-10-CM | POA: Diagnosis not present

## 2022-11-02 DIAGNOSIS — Z9189 Other specified personal risk factors, not elsewhere classified: Secondary | ICD-10-CM | POA: Diagnosis not present

## 2022-12-19 DIAGNOSIS — R0789 Other chest pain: Secondary | ICD-10-CM | POA: Diagnosis not present

## 2022-12-19 DIAGNOSIS — R072 Precordial pain: Secondary | ICD-10-CM | POA: Diagnosis not present

## 2022-12-31 DIAGNOSIS — G43009 Migraine without aura, not intractable, without status migrainosus: Secondary | ICD-10-CM | POA: Diagnosis not present

## 2022-12-31 DIAGNOSIS — F418 Other specified anxiety disorders: Secondary | ICD-10-CM | POA: Diagnosis not present

## 2023-01-11 DIAGNOSIS — Z03818 Encounter for observation for suspected exposure to other biological agents ruled out: Secondary | ICD-10-CM | POA: Diagnosis not present

## 2023-01-11 DIAGNOSIS — J069 Acute upper respiratory infection, unspecified: Secondary | ICD-10-CM | POA: Diagnosis not present

## 2023-01-16 DIAGNOSIS — F329 Major depressive disorder, single episode, unspecified: Secondary | ICD-10-CM | POA: Diagnosis not present

## 2023-02-18 DIAGNOSIS — F411 Generalized anxiety disorder: Secondary | ICD-10-CM | POA: Diagnosis not present

## 2023-02-18 DIAGNOSIS — F901 Attention-deficit hyperactivity disorder, predominantly hyperactive type: Secondary | ICD-10-CM | POA: Diagnosis not present

## 2023-05-17 DIAGNOSIS — M8589 Other specified disorders of bone density and structure, multiple sites: Secondary | ICD-10-CM | POA: Diagnosis not present

## 2023-05-17 DIAGNOSIS — C50811 Malignant neoplasm of overlapping sites of right female breast: Secondary | ICD-10-CM | POA: Diagnosis not present

## 2023-05-17 DIAGNOSIS — Z9013 Acquired absence of bilateral breasts and nipples: Secondary | ICD-10-CM | POA: Diagnosis not present

## 2023-05-17 DIAGNOSIS — R911 Solitary pulmonary nodule: Secondary | ICD-10-CM | POA: Diagnosis not present

## 2023-05-17 DIAGNOSIS — Z9189 Other specified personal risk factors, not elsewhere classified: Secondary | ICD-10-CM | POA: Diagnosis not present

## 2023-05-17 DIAGNOSIS — Z170 Estrogen receptor positive status [ER+]: Secondary | ICD-10-CM | POA: Diagnosis not present

## 2023-05-17 DIAGNOSIS — R0789 Other chest pain: Secondary | ICD-10-CM | POA: Diagnosis not present

## 2023-06-17 DIAGNOSIS — C50811 Malignant neoplasm of overlapping sites of right female breast: Secondary | ICD-10-CM | POA: Diagnosis not present

## 2023-06-17 DIAGNOSIS — Z170 Estrogen receptor positive status [ER+]: Secondary | ICD-10-CM | POA: Diagnosis not present

## 2023-06-17 DIAGNOSIS — R0789 Other chest pain: Secondary | ICD-10-CM | POA: Diagnosis not present

## 2023-07-01 DIAGNOSIS — Z9013 Acquired absence of bilateral breasts and nipples: Secondary | ICD-10-CM | POA: Diagnosis not present

## 2023-07-24 DIAGNOSIS — Z9013 Acquired absence of bilateral breasts and nipples: Secondary | ICD-10-CM | POA: Diagnosis not present

## 2023-07-24 DIAGNOSIS — Y831 Surgical operation with implant of artificial internal device as the cause of abnormal reaction of the patient, or of later complication, without mention of misadventure at the time of the procedure: Secondary | ICD-10-CM | POA: Diagnosis not present

## 2023-07-24 DIAGNOSIS — Z79899 Other long term (current) drug therapy: Secondary | ICD-10-CM | POA: Diagnosis not present

## 2023-07-24 DIAGNOSIS — T8544XA Capsular contracture of breast implant, initial encounter: Secondary | ICD-10-CM | POA: Diagnosis not present

## 2023-07-24 DIAGNOSIS — T8549XA Other mechanical complication of breast prosthesis and implant, initial encounter: Secondary | ICD-10-CM | POA: Diagnosis not present

## 2023-07-24 DIAGNOSIS — Z853 Personal history of malignant neoplasm of breast: Secondary | ICD-10-CM | POA: Diagnosis not present

## 2023-07-24 DIAGNOSIS — G4733 Obstructive sleep apnea (adult) (pediatric): Secondary | ICD-10-CM | POA: Diagnosis not present

## 2023-08-01 DIAGNOSIS — Z131 Encounter for screening for diabetes mellitus: Secondary | ICD-10-CM | POA: Diagnosis not present

## 2023-08-01 DIAGNOSIS — E785 Hyperlipidemia, unspecified: Secondary | ICD-10-CM | POA: Diagnosis not present

## 2023-08-01 DIAGNOSIS — Z0000 Encounter for general adult medical examination without abnormal findings: Secondary | ICD-10-CM | POA: Diagnosis not present

## 2023-08-01 DIAGNOSIS — F901 Attention-deficit hyperactivity disorder, predominantly hyperactive type: Secondary | ICD-10-CM | POA: Diagnosis not present

## 2023-08-01 DIAGNOSIS — F411 Generalized anxiety disorder: Secondary | ICD-10-CM | POA: Diagnosis not present

## 2023-08-01 DIAGNOSIS — M791 Myalgia, unspecified site: Secondary | ICD-10-CM | POA: Diagnosis not present

## 2023-08-01 DIAGNOSIS — Z6827 Body mass index (BMI) 27.0-27.9, adult: Secondary | ICD-10-CM | POA: Diagnosis not present

## 2023-08-01 DIAGNOSIS — E559 Vitamin D deficiency, unspecified: Secondary | ICD-10-CM | POA: Diagnosis not present

## 2023-08-20 DIAGNOSIS — M79671 Pain in right foot: Secondary | ICD-10-CM | POA: Diagnosis not present

## 2023-08-20 DIAGNOSIS — R52 Pain, unspecified: Secondary | ICD-10-CM | POA: Diagnosis not present

## 2023-08-20 DIAGNOSIS — M1711 Unilateral primary osteoarthritis, right knee: Secondary | ICD-10-CM | POA: Diagnosis not present

## 2023-09-11 ENCOUNTER — Telehealth: Payer: Self-pay | Admitting: Internal Medicine

## 2023-09-11 ENCOUNTER — Encounter: Payer: Self-pay | Admitting: Internal Medicine

## 2023-09-11 NOTE — Telephone Encounter (Signed)
 Reviewed. Yes, it is okay to set up colonoscopy in the [facility] history of polyps. She will need previsit nurse appointment prior. Thanks, Dr. Abran

## 2023-09-11 NOTE — Telephone Encounter (Signed)
 Good afternoon Dr. Abran, I received a call from this patient stating that she has meet her out of pocket expensive and was wondering if she can have her colonoscopy recall date moved up. Patient recall date is for 02/20/2024. Would you please advise.

## 2023-09-27 DIAGNOSIS — H903 Sensorineural hearing loss, bilateral: Secondary | ICD-10-CM | POA: Diagnosis not present

## 2023-11-11 ENCOUNTER — Telehealth: Payer: Self-pay | Admitting: Internal Medicine

## 2023-11-11 NOTE — Telephone Encounter (Signed)
 Inbound call from patient stating that she never received a call from the Pre Visit nurse at 8:30 am. Patient stated that she received a call from the nurse at 9:27 am which she missed due to her not being available at the time. Patient is requesting to speak to the nurse in regards to this situation. Please advise.

## 2023-11-11 NOTE — Telephone Encounter (Signed)
 Please disregard previous message. Patient is actually schedule for the 29 th.

## 2023-11-13 DIAGNOSIS — M8580 Other specified disorders of bone density and structure, unspecified site: Secondary | ICD-10-CM | POA: Diagnosis not present

## 2023-11-13 DIAGNOSIS — E559 Vitamin D deficiency, unspecified: Secondary | ICD-10-CM | POA: Diagnosis not present

## 2023-11-13 DIAGNOSIS — Z170 Estrogen receptor positive status [ER+]: Secondary | ICD-10-CM | POA: Diagnosis not present

## 2023-11-13 DIAGNOSIS — R911 Solitary pulmonary nodule: Secondary | ICD-10-CM | POA: Diagnosis not present

## 2023-11-13 DIAGNOSIS — Z9189 Other specified personal risk factors, not elsewhere classified: Secondary | ICD-10-CM | POA: Diagnosis not present

## 2023-11-13 DIAGNOSIS — N951 Menopausal and female climacteric states: Secondary | ICD-10-CM | POA: Diagnosis not present

## 2023-11-13 DIAGNOSIS — C50811 Malignant neoplasm of overlapping sites of right female breast: Secondary | ICD-10-CM | POA: Diagnosis not present

## 2023-11-18 ENCOUNTER — Encounter: Payer: Self-pay | Admitting: Internal Medicine

## 2023-11-18 ENCOUNTER — Ambulatory Visit (AMBULATORY_SURGERY_CENTER): Admitting: *Deleted

## 2023-11-18 VITALS — Ht 67.0 in | Wt 165.0 lb

## 2023-11-18 DIAGNOSIS — Z860100 Personal history of colon polyps, unspecified: Secondary | ICD-10-CM

## 2023-11-18 MED ORDER — NA SULFATE-K SULFATE-MG SULF 17.5-3.13-1.6 GM/177ML PO SOLN: 1.0000 | Freq: Once | ORAL | 0 refills | Status: AC

## 2023-11-18 NOTE — Progress Notes (Addendum)
 Pt's name and DOB verified at the beginning of the pre-visit with 2 identifiers  Pt denies any difficulty with ambulating,sitting, laying down or rolling side to side  Pt has no issues moving head neck or swallowing  No egg or soy allergy known to patient   Severe PONV  No FH of Malignant Hyperthermia  Pt is not on home 02   Pt is not on blood thinners   Pt denies issues with constipation   Pt is not on dialysis  Pt denise any abnormal heart rhythms   Pt denies any upcoming cardiac testing  Patient's chart reviewed by Norleen Schillings CNRA prior to pre-visit and patient appropriate for the [facility].  Pre-visit completed and red dot placed by patient's name on their procedure day (on provider's schedule).    Visit by phone  Pt states weight is 165 lb  Pt given  both [facility] main # and MD on call # prior to instructions.  Informed pt to come in at the time discussed and is shown on PV instructions.  Pt instructed to use [employer] or GoodRx for a price reduction on prep  Copy of instructions to be sent in mail and address read back to pt to verify correct on envelope. Instructed pt on all aspects of written instructions including med holds clothing to wear and foods to eat and not eat as well as after procedure legal restrictions and to call MD on call if needed.. Pt states understanding. Instructed pt to review instructions again prior to procedure and call main # given if has any questions or any issues. Pt states they will.

## 2023-11-19 DIAGNOSIS — M85851 Other specified disorders of bone density and structure, right thigh: Secondary | ICD-10-CM | POA: Diagnosis not present

## 2023-11-19 DIAGNOSIS — M8588 Other specified disorders of bone density and structure, other site: Secondary | ICD-10-CM | POA: Diagnosis not present

## 2023-11-19 DIAGNOSIS — M8580 Other specified disorders of bone density and structure, unspecified site: Secondary | ICD-10-CM | POA: Diagnosis not present

## 2023-11-25 ENCOUNTER — Ambulatory Visit: Admitting: Internal Medicine

## 2023-11-25 ENCOUNTER — Encounter: Payer: Self-pay | Admitting: Internal Medicine

## 2023-11-25 VITALS — BP 105/62 | HR 52 | Temp 98.8°F | Resp 9 | Ht 67.0 in | Wt 165.0 lb

## 2023-11-25 DIAGNOSIS — Z860100 Personal history of colon polyps, unspecified: Secondary | ICD-10-CM

## 2023-11-25 DIAGNOSIS — Z1211 Encounter for screening for malignant neoplasm of colon: Secondary | ICD-10-CM

## 2023-11-25 DIAGNOSIS — K5730 Diverticulosis of large intestine without perforation or abscess without bleeding: Secondary | ICD-10-CM

## 2023-11-25 DIAGNOSIS — Z860101 Personal history of adenomatous and serrated colon polyps: Secondary | ICD-10-CM

## 2023-11-25 MED ORDER — SODIUM CHLORIDE 0.9 % IV SOLN: 500.0000 mL | Freq: Once | INTRAVENOUS | Status: DC

## 2023-11-25 NOTE — Progress Notes (Signed)
 HISTORY OF PRESENT ILLNESS:  Kayla Cunningham is a 63 y.o. female with multiple adenomatous colon polyps.  Presents today for surveillance colonoscopy.  No complaints  REVIEW OF SYSTEMS:  All non-GI ROS negative except for  Past Medical History:  Diagnosis Date   Cancer (HCC) 2007   breast; right   Osteopenia    Seasonal allergies    Sleep apnea    CPAP    Past Surgical History:  Procedure Laterality Date   bilateral mastectomy  2007   BREAST RECONSTRUCTION  2007   bilateral   COLONOSCOPY     KNEE ARTHROSCOPY  1991   right   LABRAL REPAIR     right shoulder 2017   LIPOMA EXCISION  2016   back of head   MIDDLE EAR SURGERY  1978   right   NASAL SINUS SURGERY  1992   ROBOTIC ASSISTED LAP VAGINAL HYSTERECTOMY  2011   ROTATOR CUFF REPAIR     left 2020    Social History Kayla Cunningham  reports that she has never smoked. She has never used smokeless tobacco. She reports current alcohol use of about 2.0 standard drinks of alcohol per week. She reports that she does not use drugs.  family history includes Breast cancer in her paternal grandmother and sister; Prostate cancer in her brother.  Allergies  Allergen Reactions   Docetaxel Other (See Comments)    Hand foot syndrome and elevated LFT's       PHYSICAL EXAMINATION: Vital signs: BP 128/78 (BP Location: Right Arm, Patient Position: Sitting, Cuff Size: Normal)   Pulse (!) 58   Temp 98.8 F (37.1 C) (Temporal)   Ht 5' 7 (1.702 m)   Wt 165 lb (74.8 kg)   SpO2 100%   BMI 25.84 kg/m  General: Well-developed, well-nourished, no acute distress HEENT: Sclerae are anicteric, conjunctiva pink. Oral mucosa intact Lungs: Clear Heart: Regular Abdomen: soft, nontender, nondistended, no obvious ascites, no peritoneal signs, normal bowel sounds. No organomegaly. Extremities: No edema Psychiatric: alert and oriented x3. Cooperative     ASSESSMENT:  Personal history of multiple adenomatous  polyps   PLAN:  Surveillance colonoscopy

## 2023-11-25 NOTE — Op Note (Signed)
 [facility] Patient Name: Kayla Cunningham Procedure Date: 11/25/2023 10:49 AM MRN: [id number] Endoscopist: Norleen SAILOR. Abran , MD, [id number] Age: 63 Referring MD:  Date of Birth: 12-07-1960 Gender: Female Account #: [id number] Procedure:                Colonoscopy Indications:              High risk colon cancer surveillance: Personal                            history of multiple (3 or more) adenomas. Previous                            examinations 2013, 2016, 2021 Medicines:                Monitored Anesthesia Care Procedure:                Pre-Anesthesia Assessment:                           - Prior to the procedure, a History and Physical                            was performed, and patient medications and                            allergies were reviewed. The patient's tolerance of                            previous anesthesia was also reviewed. The risks                            and benefits of the procedure and the sedation                            options and risks were discussed with the patient.                            All questions were answered, and informed consent                            was obtained. Prior Anticoagulants: The patient has                            taken no anticoagulant or antiplatelet agents. ASA                            Grade Assessment: II - A patient with mild systemic                            disease. After reviewing the risks and benefits,                            the patient was deemed in satisfactory condition to  undergo the procedure.                           After obtaining informed consent, the colonoscope                            was passed under direct vision. Throughout the                            procedure, the patient's blood pressure, pulse, and                            oxygen saturations were monitored continuously. The                            CF [id number] #[id number] was  introduced through the anus                            and advanced to the the cecum, identified by                            appendiceal orifice and ileocecal valve. The                            ileocecal valve, appendiceal orifice, and rectum                            were photographed. The quality of the bowel                            preparation was excellent. The colonoscopy was                            performed without difficulty. The patient tolerated                            the procedure well. The bowel preparation used was                            SUPREP via split dose instruction. Scope In: 11:07:07 AM Scope Out: 11:21:08 AM Scope Withdrawal Time: 0 hours 10 minutes 49 seconds  Total Procedure Duration: 0 hours 14 minutes 1 second  Findings:                 A few diverticula were found in the sigmoid colon.                           The exam was otherwise without abnormality on                            direct and retroflexion views. Complications:            No immediate complications. Estimated blood loss:  None. Estimated Blood Loss:     Estimated blood loss: none. Impression:               - Diverticulosis in the sigmoid colon.                           - The examination was otherwise normal on direct                            and retroflexion views.                           - No specimens collected. Recommendation:           - Repeat colonoscopy in 5 years for surveillance                            (personal history of multiple adenomatous polyps).                           - Patient has a contact number available for                            emergencies. The signs and symptoms of potential                            delayed complications were discussed with the                            patient. Return to normal activities tomorrow.                            Written discharge instructions were provided to the                             patient.                           - Resume previous diet.                           - Continue present medications. Norleen SAILOR. Abran, MD 11/25/2023 11:29:22 AM This report has been signed electronically.

## 2023-11-25 NOTE — Patient Instructions (Signed)
 Thank you for letting us  take are of your healthcare needs today. Please see handouts given to you on Diverticulosis.    YOU HAD AN ENDOSCOPIC PROCEDURE TODAY AT THE [facility]:   Refer to the procedure report that was given to you for any specific questions about what was found during the examination.  If the procedure report does not answer your questions, please call your gastroenterologist to clarify.  If you requested that your care partner not be given the details of your procedure findings, then the procedure report has been included in a sealed envelope for you to review at your convenience later.  YOU SHOULD EXPECT: Some feelings of bloating in the abdomen. Passage of more gas than usual.  Walking can help get rid of the air that was put into your GI tract during the procedure and reduce the bloating. If you had a lower endoscopy (such as a colonoscopy or flexible sigmoidoscopy) you may notice spotting of blood in your stool or on the toilet paper. If you underwent a bowel prep for your procedure, you may not have a normal bowel movement for a few days.  Please Note:  You might notice some irritation and congestion in your nose or some drainage.  This is from the oxygen used during your procedure.  There is no need for concern and it should clear up in a day or so.  SYMPTOMS TO REPORT IMMEDIATELY:  Following lower endoscopy (colonoscopy or flexible sigmoidoscopy):  Excessive amounts of blood in the stool  Significant tenderness or worsening of abdominal pains  Swelling of the abdomen that is new, acute  Fever of 100F or higher   For urgent or emergent issues, a gastroenterologist can be reached at any hour by calling [phone]. Do not use MyChart messaging for urgent concerns.    DIET:  We do recommend a small meal at first, but then you may proceed to your regular diet.  Drink plenty of fluids but you should avoid alcoholic beverages for 24 hours.  ACTIVITY:   You should plan to take it easy for the rest of today and you should NOT DRIVE or use heavy machinery until tomorrow (because of the sedation medicines used during the test).    FOLLOW UP: Our staff will call the number listed on your records the next business day following your procedure.  We will call around 7:15- 8:00 am to check on you and address any questions or concerns that you may have regarding the information given to you following your procedure. If we do not reach you, we will leave a message.     If any biopsies were taken you will be contacted by phone or by letter within the next 1-3 weeks.  Please call us  at [phone] if you have not heard about the biopsies in 3 weeks.    SIGNATURES/CONFIDENTIALITY: You and/or your care partner have signed paperwork which will be entered into your electronic medical record.  These signatures attest to the fact that that the information above on your After Visit Summary has been reviewed and is understood.  Full responsibility of the confidentiality of this discharge information lies with you and/or your care-partner.

## 2023-11-25 NOTE — Progress Notes (Signed)
 To PACU via stretcher, sedated, good respiratory effort, VSS.

## 2023-11-25 NOTE — Progress Notes (Signed)
Vitals-CW  Pt's states no medical or surgical changes since previsit or office visit. 

## 2023-11-26 ENCOUNTER — Telehealth: Payer: Self-pay | Admitting: *Deleted

## 2023-11-26 NOTE — Telephone Encounter (Signed)
  Follow up Call-     11/25/2023    9:49 AM 11/25/2023    9:45 AM  Call back number  Post procedure Call Back phone  # [phone]   Permission to leave phone message  Yes     Patient questions:  Do you have a fever, pain , or abdominal swelling? No. Pain Score  0 *  Have you tolerated food without any problems? Yes.    Have you been able to return to your normal activities? Yes.    Do you have any questions about your discharge instructions: Diet   No. Medications  No. Follow up visit  No.  Do you have questions or concerns about your Care? No.  Actions: * If pain score is 4 or above: No action needed, pain <4.

## 2023-12-30 DIAGNOSIS — D1721 Benign lipomatous neoplasm of skin and subcutaneous tissue of right arm: Secondary | ICD-10-CM | POA: Diagnosis not present

## 2024-01-06 DIAGNOSIS — M79645 Pain in left finger(s): Secondary | ICD-10-CM | POA: Diagnosis not present

## 2024-01-06 DIAGNOSIS — Z6827 Body mass index (BMI) 27.0-27.9, adult: Secondary | ICD-10-CM | POA: Diagnosis not present

## 2024-01-06 DIAGNOSIS — M79644 Pain in right finger(s): Secondary | ICD-10-CM | POA: Diagnosis not present

## 2024-01-06 DIAGNOSIS — Z820 Family history of epilepsy and other diseases of the nervous system: Secondary | ICD-10-CM | POA: Diagnosis not present

## 2024-01-23 DIAGNOSIS — M79642 Pain in left hand: Secondary | ICD-10-CM | POA: Diagnosis not present

## 2024-01-23 DIAGNOSIS — M180 Bilateral primary osteoarthritis of first carpometacarpal joints: Secondary | ICD-10-CM | POA: Diagnosis not present

## 2024-01-23 DIAGNOSIS — M1812 Unilateral primary osteoarthritis of first carpometacarpal joint, left hand: Secondary | ICD-10-CM | POA: Diagnosis not present

## 2024-01-23 DIAGNOSIS — M79641 Pain in right hand: Secondary | ICD-10-CM | POA: Diagnosis not present

## 2024-01-23 DIAGNOSIS — M19042 Primary osteoarthritis, left hand: Secondary | ICD-10-CM | POA: Diagnosis not present

## 2024-01-23 DIAGNOSIS — M182 Bilateral post-traumatic osteoarthritis of first carpometacarpal joints: Secondary | ICD-10-CM | POA: Diagnosis not present

## 2024-01-23 DIAGNOSIS — M19041 Primary osteoarthritis, right hand: Secondary | ICD-10-CM | POA: Diagnosis not present

## 2024-02-03 DIAGNOSIS — F411 Generalized anxiety disorder: Secondary | ICD-10-CM | POA: Diagnosis not present

## 2024-02-03 DIAGNOSIS — F901 Attention-deficit hyperactivity disorder, predominantly hyperactive type: Secondary | ICD-10-CM | POA: Diagnosis not present
